# Patient Record
Sex: Male | Born: 1950 | Race: White | Hispanic: No | Smoking: Current every day smoker
Health system: Southern US, Community
[De-identification: ages and names within clinical notes are randomized; demographics above are authoritative.]

## PROBLEM LIST (undated history)

## (undated) DIAGNOSIS — J439 Emphysema, unspecified: Secondary | ICD-10-CM

## (undated) DIAGNOSIS — M1990 Unspecified osteoarthritis, unspecified site: Secondary | ICD-10-CM

## (undated) DIAGNOSIS — Z8719 Personal history of other diseases of the digestive system: Secondary | ICD-10-CM

## (undated) DIAGNOSIS — J449 Chronic obstructive pulmonary disease, unspecified: Secondary | ICD-10-CM

## (undated) DIAGNOSIS — D649 Anemia, unspecified: Secondary | ICD-10-CM

## (undated) DIAGNOSIS — K219 Gastro-esophageal reflux disease without esophagitis: Secondary | ICD-10-CM

## (undated) DIAGNOSIS — Z8711 Personal history of peptic ulcer disease: Secondary | ICD-10-CM

## (undated) DIAGNOSIS — J180 Bronchopneumonia, unspecified organism: Secondary | ICD-10-CM

## (undated) DIAGNOSIS — J45909 Unspecified asthma, uncomplicated: Secondary | ICD-10-CM

## (undated) HISTORY — PX: TONSILLECTOMY AND ADENOIDECTOMY: SUR1326

## (undated) HISTORY — PX: ESOPHAGOGASTRODUODENOSCOPY: SHX1529

## (undated) HISTORY — PX: COLONOSCOPY W/ BIOPSIES AND POLYPECTOMY: SHX1376

## (undated) HISTORY — PX: SKIN CANCER EXCISION: SHX779

---

## 1999-05-17 ENCOUNTER — Emergency Department (HOSPITAL_COMMUNITY): Admission: EM | Admit: 1999-05-17 | Discharge: 1999-05-17 | Payer: Self-pay | Admitting: *Deleted

## 1999-05-17 ENCOUNTER — Encounter: Payer: Self-pay | Admitting: *Deleted

## 1999-05-19 ENCOUNTER — Ambulatory Visit (HOSPITAL_COMMUNITY): Admission: RE | Admit: 1999-05-19 | Discharge: 1999-05-19 | Payer: Self-pay | Admitting: Sports Medicine

## 1999-05-19 ENCOUNTER — Encounter: Payer: Self-pay | Admitting: Sports Medicine

## 1999-05-20 ENCOUNTER — Ambulatory Visit (HOSPITAL_COMMUNITY): Admission: RE | Admit: 1999-05-20 | Discharge: 1999-05-20 | Payer: Self-pay | Admitting: Sports Medicine

## 1999-05-20 ENCOUNTER — Encounter: Payer: Self-pay | Admitting: Sports Medicine

## 1999-06-10 ENCOUNTER — Emergency Department (HOSPITAL_COMMUNITY): Admission: EM | Admit: 1999-06-10 | Discharge: 1999-06-10 | Payer: Self-pay | Admitting: Emergency Medicine

## 1999-06-10 ENCOUNTER — Encounter: Payer: Self-pay | Admitting: Emergency Medicine

## 2005-01-03 ENCOUNTER — Encounter: Admission: RE | Admit: 2005-01-03 | Discharge: 2005-01-03 | Payer: Self-pay | Admitting: Emergency Medicine

## 2010-05-26 DEATH — deceased

## 2010-06-26 DEATH — deceased

## 2017-06-23 ENCOUNTER — Observation Stay (HOSPITAL_COMMUNITY)
Admission: EM | Admit: 2017-06-23 | Discharge: 2017-06-25 | Disposition: A | Discharge status: Home / Self Care | Payer: Commercial Managed Care - PPO | Attending: General Surgery | Admitting: General Surgery

## 2017-06-23 ENCOUNTER — Emergency Department (HOSPITAL_COMMUNITY): Payer: Commercial Managed Care - PPO

## 2017-06-23 ENCOUNTER — Encounter (HOSPITAL_COMMUNITY): Payer: Self-pay | Admitting: Emergency Medicine

## 2017-06-23 ENCOUNTER — Other Ambulatory Visit: Payer: Self-pay

## 2017-06-23 DIAGNOSIS — S2242XA Multiple fractures of ribs, left side, initial encounter for closed fracture: Secondary | ICD-10-CM

## 2017-06-23 DIAGNOSIS — M419 Scoliosis, unspecified: Secondary | ICD-10-CM | POA: Insufficient documentation

## 2017-06-23 DIAGNOSIS — J439 Emphysema, unspecified: Secondary | ICD-10-CM | POA: Diagnosis not present

## 2017-06-23 DIAGNOSIS — N400 Enlarged prostate without lower urinary tract symptoms: Secondary | ICD-10-CM | POA: Diagnosis not present

## 2017-06-23 DIAGNOSIS — S42032A Displaced fracture of lateral end of left clavicle, initial encounter for closed fracture: Secondary | ICD-10-CM | POA: Diagnosis not present

## 2017-06-23 DIAGNOSIS — Z23 Encounter for immunization: Secondary | ICD-10-CM | POA: Insufficient documentation

## 2017-06-23 DIAGNOSIS — I700 Atherosclerosis of aorta: Secondary | ICD-10-CM | POA: Diagnosis not present

## 2017-06-23 DIAGNOSIS — S42002A Fracture of unspecified part of left clavicle, initial encounter for closed fracture: Secondary | ICD-10-CM

## 2017-06-23 DIAGNOSIS — M6281 Muscle weakness (generalized): Secondary | ICD-10-CM | POA: Diagnosis not present

## 2017-06-23 DIAGNOSIS — S2243XA Multiple fractures of ribs, bilateral, initial encounter for closed fracture: Principal | ICD-10-CM | POA: Insufficient documentation

## 2017-06-23 DIAGNOSIS — W2210XA Striking against or struck by unspecified automobile airbag, initial encounter: Secondary | ICD-10-CM | POA: Diagnosis not present

## 2017-06-23 DIAGNOSIS — F17210 Nicotine dependence, cigarettes, uncomplicated: Secondary | ICD-10-CM | POA: Diagnosis not present

## 2017-06-23 DIAGNOSIS — N2889 Other specified disorders of kidney and ureter: Secondary | ICD-10-CM | POA: Diagnosis not present

## 2017-06-23 DIAGNOSIS — R0902 Hypoxemia: Secondary | ICD-10-CM | POA: Diagnosis present

## 2017-06-23 DIAGNOSIS — S2220XA Unspecified fracture of sternum, initial encounter for closed fracture: Secondary | ICD-10-CM | POA: Diagnosis present

## 2017-06-23 DIAGNOSIS — R918 Other nonspecific abnormal finding of lung field: Secondary | ICD-10-CM | POA: Diagnosis not present

## 2017-06-23 HISTORY — DX: Personal history of peptic ulcer disease: Z87.11

## 2017-06-23 HISTORY — DX: Anemia, unspecified: D64.9

## 2017-06-23 HISTORY — DX: Bronchopneumonia, unspecified organism: J18.0

## 2017-06-23 HISTORY — DX: Unspecified osteoarthritis, unspecified site: M19.90

## 2017-06-23 HISTORY — DX: Gastro-esophageal reflux disease without esophagitis: K21.9

## 2017-06-23 HISTORY — DX: Personal history of other diseases of the digestive system: Z87.19

## 2017-06-23 HISTORY — DX: Emphysema, unspecified: J43.9

## 2017-06-23 HISTORY — DX: Unspecified asthma, uncomplicated: J45.909

## 2017-06-23 HISTORY — DX: Chronic obstructive pulmonary disease, unspecified: J44.9

## 2017-06-23 LAB — COMPREHENSIVE METABOLIC PANEL
ALBUMIN: 3.8 g/dL (ref 3.5–5.0)
ALT: 22 U/L (ref 17–63)
ANION GAP: 11 (ref 5–15)
AST: 47 U/L — ABNORMAL HIGH (ref 15–41)
Alkaline Phosphatase: 68 U/L (ref 38–126)
BUN: 14 mg/dL (ref 6–20)
CHLORIDE: 100 mmol/L — AB (ref 101–111)
CO2: 25 mmol/L (ref 22–32)
Calcium: 9.3 mg/dL (ref 8.9–10.3)
Creatinine, Ser: 0.91 mg/dL (ref 0.61–1.24)
GFR calc Af Amer: >60 mL/min (ref >60)
GFR calc non Af Amer: >60 mL/min (ref >60)
Glucose, Bld: 84 mg/dL (ref 65–99)
POTASSIUM: 4.8 mmol/L (ref 3.5–5.1)
SODIUM: 136 mmol/L (ref 135–145)
Total Bilirubin: 0.9 mg/dL (ref 0.3–1.2)
Total Protein: 7.2 g/dL (ref 6.5–8.1)

## 2017-06-23 LAB — CBC
HEMATOCRIT: 47.8 % (ref 39.0–52.0)
HEMOGLOBIN: 15.8 g/dL (ref 13.0–17.0)
MCH: 30.7 pg (ref 26.0–34.0)
MCHC: 33.1 g/dL (ref 30.0–36.0)
MCV: 93 fL (ref 78.0–100.0)
Platelets: 252 10*3/uL (ref 150–400)
RBC: 5.14 MIL/uL (ref 4.22–5.81)
RDW: 14.7 % (ref 11.5–15.5)
WBC: 7.9 10*3/uL (ref 4.0–10.5)

## 2017-06-23 LAB — I-STAT CHEM 8, ED
BUN: 20 mg/dL (ref 6–20)
CALCIUM ION: 1.05 mmol/L — AB (ref 1.15–1.40)
Chloride: 98 mmol/L — ABNORMAL LOW (ref 101–111)
Creatinine, Ser: 1 mg/dL (ref 0.61–1.24)
Glucose, Bld: 89 mg/dL (ref 65–99)
HEMATOCRIT: 51 % (ref 39.0–52.0)
HEMOGLOBIN: 17.3 g/dL — AB (ref 13.0–17.0)
Potassium: 4.7 mmol/L (ref 3.5–5.1)
SODIUM: 136 mmol/L (ref 135–145)
TCO2: 27 mmol/L (ref 22–32)

## 2017-06-23 LAB — I-STAT TROPONIN, ED: Troponin i, poc: 0 ng/mL (ref 0.00–0.08)

## 2017-06-23 MED ORDER — MORPHINE SULFATE (PF) 4 MG/ML IV SOLN
4.0000 mg | Freq: Once | INTRAVENOUS | Status: AC
  Administered 2017-06-23: 4 mg via INTRAVENOUS
  Filled 2017-06-23: qty 1

## 2017-06-23 MED ORDER — NICOTINE 7 MG/24HR TD PT24
7.0000 mg | MEDICATED_PATCH | Freq: Once | TRANSDERMAL | Status: AC
  Administered 2017-06-24: 7 mg via TRANSDERMAL
  Filled 2017-06-23: qty 1

## 2017-06-23 MED ORDER — IOHEXOL 300 MG/ML  SOLN
100.0000 mL | Freq: Once | INTRAMUSCULAR | Status: AC | PRN
  Administered 2017-06-23: 100 mL via INTRAVENOUS
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
## 2017-06-23 NOTE — ED Notes (Signed)
Patient transported to X-ray 

## 2017-06-24 ENCOUNTER — Encounter (HOSPITAL_COMMUNITY): Payer: Self-pay | Admitting: General Practice

## 2017-06-24 LAB — CBC
HEMATOCRIT: 49.6 % (ref 39.0–52.0)
Hemoglobin: 16.5 g/dL (ref 13.0–17.0)
MCH: 31 pg (ref 26.0–34.0)
MCHC: 33.3 g/dL (ref 30.0–36.0)
MCV: 93.2 fL (ref 78.0–100.0)
Platelets: 240 10*3/uL (ref 150–400)
RBC: 5.32 MIL/uL (ref 4.22–5.81)
RDW: 14.6 % (ref 11.5–15.5)
WBC: 7 10*3/uL (ref 4.0–10.5)

## 2017-06-24 LAB — CREATININE, SERUM: Creatinine, Ser: 0.71 mg/dL (ref 0.61–1.24)

## 2017-06-24 MED ORDER — MORPHINE SULFATE (PF) 4 MG/ML IV SOLN
4.0000 mg | Freq: Once | INTRAVENOUS | Status: AC
  Administered 2017-06-24: 4 mg via INTRAVENOUS
  Filled 2017-06-24: qty 1

## 2017-06-24 MED ORDER — ENOXAPARIN SODIUM 40 MG/0.4ML ~~LOC~~ SOLN
40.0000 mg | SUBCUTANEOUS | Status: DC
  Administered 2017-06-24 – 2017-06-25 (×2): 40 mg via SUBCUTANEOUS
  Filled 2017-06-24 (×2): qty 0.4

## 2017-06-24 MED ORDER — TETANUS-DIPHTH-ACELL PERTUSSIS 5-2.5-18.5 LF-MCG/0.5 IM SUSP
0.5000 mL | Freq: Once | INTRAMUSCULAR | Status: AC
  Administered 2017-06-24: 0.5 mL via INTRAMUSCULAR
  Filled 2017-06-24: qty 0.5

## 2017-06-24 MED ORDER — ONDANSETRON 4 MG PO TBDP: 4.0000 mg | ORAL_TABLET | Freq: Four times a day (QID) | ORAL | Status: DC | PRN

## 2017-06-24 MED ORDER — OXYCODONE HCL 5 MG PO TABS
5.0000 mg | ORAL_TABLET | ORAL | Status: DC | PRN
  Administered 2017-06-24 – 2017-06-25 (×4): 5 mg via ORAL
  Filled 2017-06-24 (×4): qty 1

## 2017-06-24 MED ORDER — IPRATROPIUM-ALBUTEROL 0.5-2.5 (3) MG/3ML IN SOLN
3.0000 mL | Freq: Four times a day (QID) | RESPIRATORY_TRACT | Status: DC
  Administered 2017-06-24: 3 mL via RESPIRATORY_TRACT
  Filled 2017-06-24 (×3): qty 3

## 2017-06-24 MED ORDER — DOCUSATE SODIUM 100 MG PO CAPS
100.0000 mg | ORAL_CAPSULE | Freq: Two times a day (BID) | ORAL | Status: DC
  Administered 2017-06-24 – 2017-06-25 (×3): 100 mg via ORAL
  Filled 2017-06-24 (×3): qty 1

## 2017-06-24 MED ORDER — SODIUM CHLORIDE 0.9 % IV SOLN
INTRAVENOUS | Status: DC
  Administered 2017-06-24 (×3): via INTRAVENOUS

## 2017-06-24 MED ORDER — HYDRALAZINE HCL 20 MG/ML IJ SOLN: 10.0000 mg | INTRAMUSCULAR | Status: DC | PRN

## 2017-06-24 MED ORDER — ONDANSETRON HCL 4 MG/2ML IJ SOLN: 4.0000 mg | Freq: Four times a day (QID) | INTRAMUSCULAR | Status: DC | PRN

## 2017-06-24 MED ORDER — PANTOPRAZOLE SODIUM 40 MG PO TBEC
40.0000 mg | DELAYED_RELEASE_TABLET | Freq: Every day | ORAL | Status: DC
  Administered 2017-06-24 – 2017-06-25 (×2): 40 mg via ORAL
  Filled 2017-06-24 (×2): qty 1

## 2017-06-24 MED ORDER — MORPHINE SULFATE (PF) 4 MG/ML IV SOLN
2.0000 mg | INTRAVENOUS | Status: DC | PRN
  Filled 2017-06-24 (×2): qty 1
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
## 2017-06-24 NOTE — ED Notes (Signed)
Pt provided with incentive spirometer and instructed on use and recommended frequency of use
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
## 2017-06-24 NOTE — Progress Notes (Signed)
Orthopedic Tech Progress Note Patient Details:  Robert Jefferson July 14, 1950 [id number]  Ortho Devices Type of Ortho Device: Arm sling Ortho Device/Splint Location: lue Ortho Device/Splint Interventions: Application   Post Interventions Patient Tolerated: Well Instructions Provided: Care of device   Nikki Dom 06/24/2017, 11:08 AM
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
## 2017-06-25 LAB — HIV ANTIBODY (ROUTINE TESTING W REFLEX): HIV SCREEN 4TH GENERATION: NONREACTIVE

## 2017-06-25 MED ORDER — IPRATROPIUM-ALBUTEROL 0.5-2.5 (3) MG/3ML IN SOLN: 3.0000 mL | RESPIRATORY_TRACT | Status: DC | PRN

## 2017-06-25 MED ORDER — OXYCODONE HCL 5 MG PO TABS: 5.0000 mg | ORAL_TABLET | Freq: Four times a day (QID) | ORAL | 0 refills | Status: AC | PRN

## 2017-06-25 NOTE — Progress Notes (Signed)
Discharged home today with wife .Personal belongings,discharged instructions given to patient. Take home meds called in to pharmacy of choice, advised to pick up meds from pharmacy. Verbalized understanding of instructions. No further questions asked
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
## 2020-02-09 ENCOUNTER — Encounter (HOSPITAL_COMMUNITY): Payer: Self-pay

## 2020-02-09 ENCOUNTER — Emergency Department (HOSPITAL_COMMUNITY): Payer: Commercial Managed Care - PPO

## 2020-02-09 ENCOUNTER — Emergency Department (HOSPITAL_COMMUNITY)
Admission: EM | Admit: 2020-02-09 | Discharge: 2020-02-09 | Disposition: A | Discharge status: Home / Self Care | Payer: Commercial Managed Care - PPO | Attending: Emergency Medicine | Admitting: Emergency Medicine

## 2020-02-09 ENCOUNTER — Other Ambulatory Visit: Payer: Self-pay

## 2020-02-09 DIAGNOSIS — J441 Chronic obstructive pulmonary disease with (acute) exacerbation: Secondary | ICD-10-CM | POA: Insufficient documentation

## 2020-02-09 DIAGNOSIS — S12031A Nondisplaced posterior arch fracture of first cervical vertebra, initial encounter for closed fracture: Secondary | ICD-10-CM | POA: Insufficient documentation

## 2020-02-09 DIAGNOSIS — S12191A Other nondisplaced fracture of second cervical vertebra, initial encounter for closed fracture: Secondary | ICD-10-CM | POA: Diagnosis not present

## 2020-02-09 DIAGNOSIS — S12101A Unspecified nondisplaced fracture of second cervical vertebra, initial encounter for closed fracture: Secondary | ICD-10-CM

## 2020-02-09 DIAGNOSIS — S12000A Unspecified displaced fracture of first cervical vertebra, initial encounter for closed fracture: Secondary | ICD-10-CM | POA: Diagnosis present

## 2020-02-09 DIAGNOSIS — W01190A Fall on same level from slipping, tripping and stumbling with subsequent striking against furniture, initial encounter: Secondary | ICD-10-CM | POA: Insufficient documentation

## 2020-02-09 DIAGNOSIS — F17210 Nicotine dependence, cigarettes, uncomplicated: Secondary | ICD-10-CM | POA: Diagnosis not present

## 2020-02-09 DIAGNOSIS — S12001A Unspecified nondisplaced fracture of first cervical vertebra, initial encounter for closed fracture: Secondary | ICD-10-CM

## 2020-02-09 LAB — BASIC METABOLIC PANEL
Anion gap: 11 (ref 5–15)
BUN: 6 mg/dL — ABNORMAL LOW (ref 8–23)
CO2: 28 mmol/L (ref 22–32)
Calcium: 9.7 mg/dL (ref 8.9–10.3)
Chloride: 100 mmol/L (ref 98–111)
Creatinine, Ser: 0.63 mg/dL (ref 0.61–1.24)
GFR, Estimated: >60 mL/min (ref >60)
Glucose, Bld: 95 mg/dL (ref 70–99)
Potassium: 4.5 mmol/L (ref 3.5–5.1)
Sodium: 139 mmol/L (ref 135–145)

## 2020-02-09 LAB — I-STAT CHEM 8, ED
BUN: 8 mg/dL (ref 8–23)
Calcium, Ion: 1.19 mmol/L (ref 1.15–1.40)
Chloride: 100 mmol/L (ref 98–111)
Creatinine, Ser: 0.6 mg/dL — ABNORMAL LOW (ref 0.61–1.24)
Glucose, Bld: 92 mg/dL (ref 70–99)
HCT: 48 % (ref 39.0–52.0)
Hemoglobin: 16.3 g/dL (ref 13.0–17.0)
Potassium: 4.5 mmol/L (ref 3.5–5.1)
Sodium: 140 mmol/L (ref 135–145)
TCO2: 29 mmol/L (ref 22–32)

## 2020-02-09 LAB — CBC WITH DIFFERENTIAL/PLATELET
Abs Immature Granulocytes: 0.03 10*3/uL (ref 0.00–0.07)
Basophils Absolute: 0.1 10*3/uL (ref 0.0–0.1)
Basophils Relative: 1 %
Eosinophils Absolute: 0.2 10*3/uL (ref 0.0–0.5)
Eosinophils Relative: 2 %
HCT: 44.5 % (ref 39.0–52.0)
Hemoglobin: 15.1 g/dL (ref 13.0–17.0)
Immature Granulocytes: 0 %
Lymphocytes Relative: 15 %
Lymphs Abs: 1 10*3/uL (ref 0.7–4.0)
MCH: 33.9 pg (ref 26.0–34.0)
MCHC: 33.9 g/dL (ref 30.0–36.0)
MCV: 99.8 fL (ref 80.0–100.0)
Monocytes Absolute: 0.7 10*3/uL (ref 0.1–1.0)
Monocytes Relative: 10 %
Neutro Abs: 5.1 10*3/uL (ref 1.7–7.7)
Neutrophils Relative %: 72 %
Platelets: 292 10*3/uL (ref 150–400)
RBC: 4.46 MIL/uL (ref 4.22–5.81)
RDW: 14 % (ref 11.5–15.5)
WBC: 7 10*3/uL (ref 4.0–10.5)
nRBC: 0 % (ref 0.0–0.2)

## 2020-02-09 MED ORDER — IPRATROPIUM-ALBUTEROL 0.5-2.5 (3) MG/3ML IN SOLN
3.0000 mL | Freq: Once | RESPIRATORY_TRACT | Status: AC
  Administered 2020-02-09: 3 mL via RESPIRATORY_TRACT
  Filled 2020-02-09: qty 3

## 2020-02-09 MED ORDER — HYDROCODONE-ACETAMINOPHEN 5-325 MG PO TABS
1.0000 | ORAL_TABLET | Freq: Once | ORAL | Status: AC
  Administered 2020-02-09: 1 via ORAL
  Filled 2020-02-09: qty 1

## 2020-02-09 MED ORDER — ALBUTEROL SULFATE HFA 108 (90 BASE) MCG/ACT IN AERS
1.0000 | INHALATION_SPRAY | Freq: Once | RESPIRATORY_TRACT | Status: AC
  Administered 2020-02-09: 1 via RESPIRATORY_TRACT
  Filled 2020-02-09: qty 6.7

## 2020-02-09 MED ORDER — METHYLPREDNISOLONE SODIUM SUCC 125 MG IJ SOLR
125.0000 mg | Freq: Once | INTRAMUSCULAR | Status: AC
  Administered 2020-02-09: 125 mg via INTRAVENOUS
  Filled 2020-02-09: qty 2

## 2020-02-09 MED ORDER — PREDNISONE 10 MG PO TABS: 40.0000 mg | ORAL_TABLET | Freq: Every day | ORAL | 0 refills | Status: AC

## 2020-02-09 MED ORDER — IOHEXOL 350 MG/ML SOLN
75.0000 mL | Freq: Once | INTRAVENOUS | Status: AC | PRN
  Administered 2020-02-09: 75 mL via INTRAVENOUS

## 2020-02-09 NOTE — Discharge Instructions (Addendum)
Take the pain medication as needed to help with your symptoms. You will need to wear the brace at all times including when you shower as directed by the neurosurgeon. Follow-up with Dr. Maisie Fus at the office listed below. Take the steroids beginning tomorrow. If you start to have shortness of breath, increased cough or chest pain return to the ER. Return to the ER if you start to experience additional injuries or falls, numbness in your arms or legs, vision changes, trouble walking
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
## 2020-02-09 NOTE — ED Notes (Signed)
Pt room air SpO2 was 88%. Pt placed on 4L [place] and SpO2 is now 93%
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
## 2020-02-09 NOTE — ED Provider Notes (Signed)
This patient is a 70 year old male, he fell approximately 1 week ago striking the front of his head and since that time has had some neck pain. On my exam the patient is in no distress, he does have some tenderness over the neck and is in a [place] J collar since arrival. Unfortunately the patient does have multiple fractures of his neck none of which seem to be compromising his spinal cord and neurologically the patient is unremarkable. CT angiogram will be performed to rule out vascular injury due to the location of the spinal fractures. At this time the patient appears well, he has been instructed that he will need to wear this neck brace and follow-up with neurosurgery. Neurosurgery was consulted, please see the physician assistant notes.  Of note the patient had some borderline hypoxia, he has COPD, he states he is not feeling short of breath and refuses to be admitted to the hospital or receive further treatment or intervention for his borderline hypoxia.  He has medical decision-making capacity and wants to go home.  He understands he can come back should he change his mind or get worse.  Medical screening examination/treatment/procedure(s) were conducted as a shared visit with non-physician practitioner(s) and myself.  I personally evaluated the patient during the encounter.  Clinical Impression:   Final diagnoses:  Closed nondisplaced fracture of first cervical vertebra, unspecified fracture morphology, initial encounter (HCC)  Closed nondisplaced fracture of second cervical vertebra, unspecified fracture morphology, initial encounter (HCC)  Chronic obstructive pulmonary disease with acute exacerbation (HCC)         Eber Hong, MD 02/10/20 1510

## 2020-04-25 ENCOUNTER — Other Ambulatory Visit: Payer: Self-pay | Admitting: Neurosurgery

## 2020-04-25 DIAGNOSIS — S12130A Unspecified traumatic displaced spondylolisthesis of second cervical vertebra, initial encounter for closed fracture: Secondary | ICD-10-CM

## 2020-05-24 ENCOUNTER — Other Ambulatory Visit: Payer: Self-pay

## 2020-05-24 ENCOUNTER — Ambulatory Visit
Admission: RE | Admit: 2020-05-24 | Discharge: 2020-05-24 | Disposition: A | Discharge status: Home / Self Care | Payer: Commercial Managed Care - PPO | Source: Ambulatory Visit | Attending: Neurosurgery | Admitting: Neurosurgery

## 2020-05-24 DIAGNOSIS — S12130A Unspecified traumatic displaced spondylolisthesis of second cervical vertebra, initial encounter for closed fracture: Secondary | ICD-10-CM

## 2020-07-23 ENCOUNTER — Other Ambulatory Visit: Payer: Self-pay | Admitting: Neurosurgery

## 2020-07-23 DIAGNOSIS — S12130A Unspecified traumatic displaced spondylolisthesis of second cervical vertebra, initial encounter for closed fracture: Secondary | ICD-10-CM

## 2020-08-12 ENCOUNTER — Ambulatory Visit
Admission: RE | Admit: 2020-08-12 | Discharge: 2020-08-12 | Disposition: A | Discharge status: Home / Self Care | Payer: Commercial Managed Care - PPO | Source: Ambulatory Visit | Attending: Neurosurgery | Admitting: Neurosurgery

## 2020-08-12 DIAGNOSIS — S12130A Unspecified traumatic displaced spondylolisthesis of second cervical vertebra, initial encounter for closed fracture: Secondary | ICD-10-CM

## 2022-10-26 IMAGING — CT CT CERVICAL SPINE W/O CM
3 series · 8 of 14 positions shown, 9 images · non-contrast
Comparison: Cervical spine CT 02/09/2020, 06/23/2017.

CLINICAL DATA: 69-year-old male status post C1 anterior ring
fracture, C2 fracture, C3 anterior inferior corner fracture, and C4
spinous process fractures in [REDACTED]. Subsequent encounter.

EXAM:
CT CERVICAL SPINE WITHOUT CONTRAST
TECHNIQUE: Multidetector CT imaging of the cervical spine was performed without
intravenous contrast. Multiplanar CT image reconstructions were also
generated.

[Series 2: cspine soft · axial · 0.36mm/px · z∈[-72,-4]mm · 2 of 102 slices shown]
[im 34/102  soft-tissue]
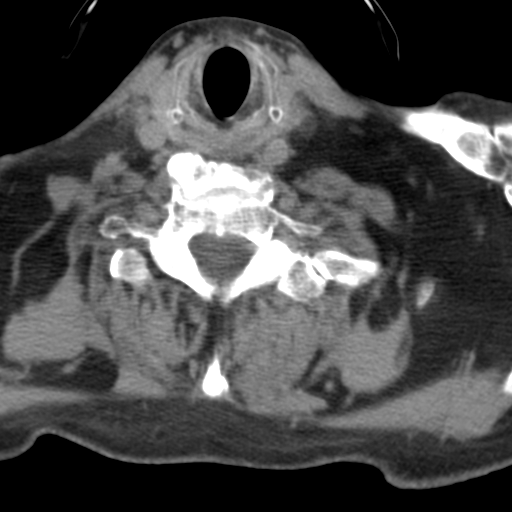
[im 68/102  soft-tissue]
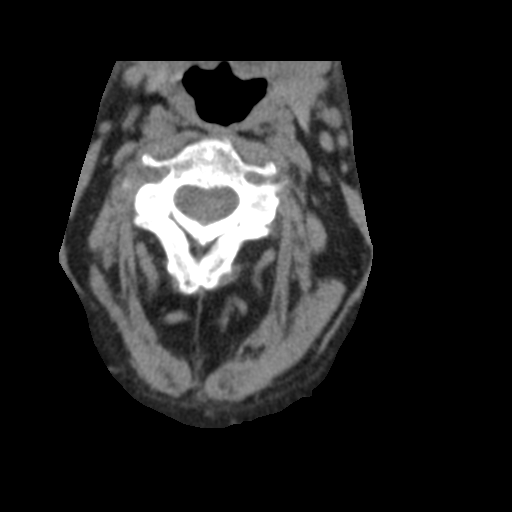

[Series 3: c spine bone · axial · 0.36mm/px · z∈[-86,+14]mm · 3 of 100 slices shown, 4 images]
[im 25/100  soft-tissue]
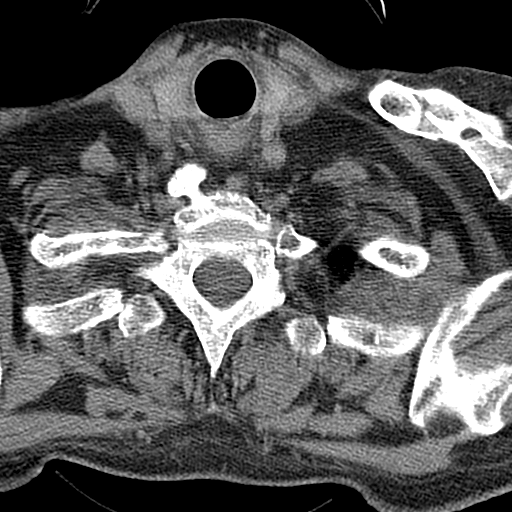
[im 25/100  bone]
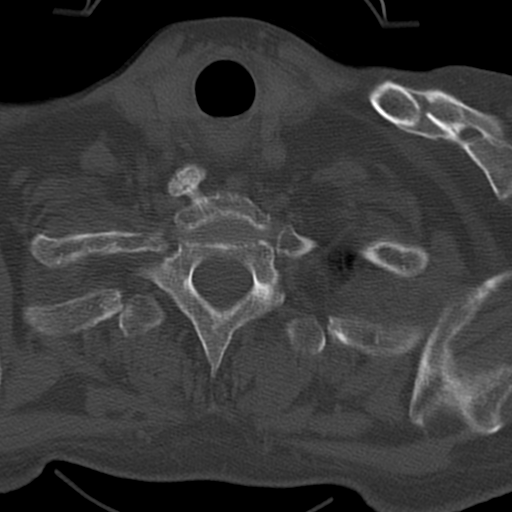
[im 50/100  bone]
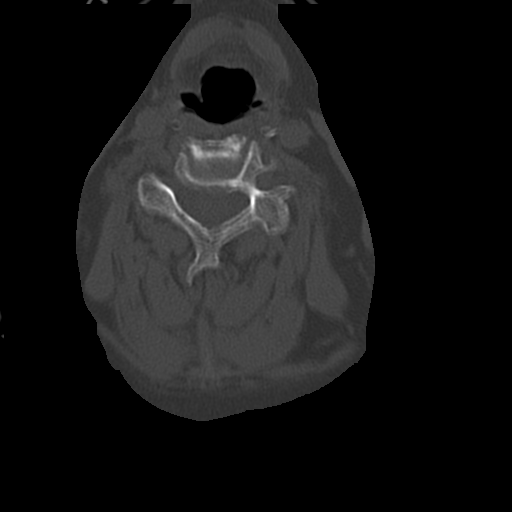
[im 75/100  bone]
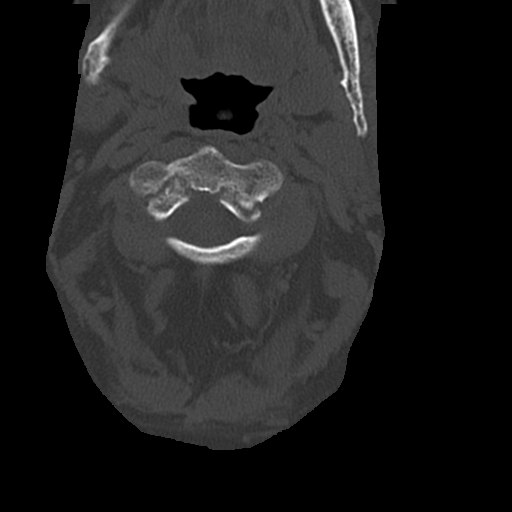

[Series 8: angled axial soft · axial · 0.29mm/px · z∈[-112,-17]mm · 3 of 101 slices shown]
[im 26/101  soft-tissue]
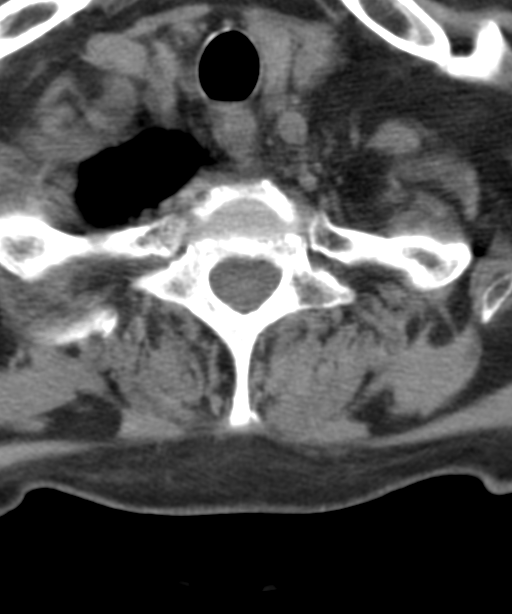
[im 51/101  soft-tissue]
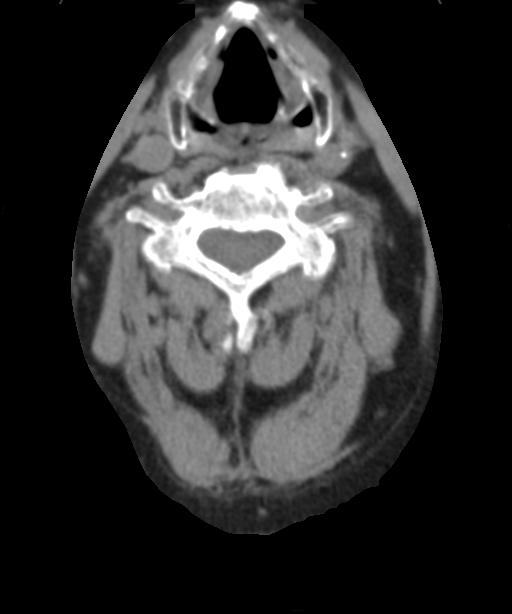
[im 76/101  soft-tissue]
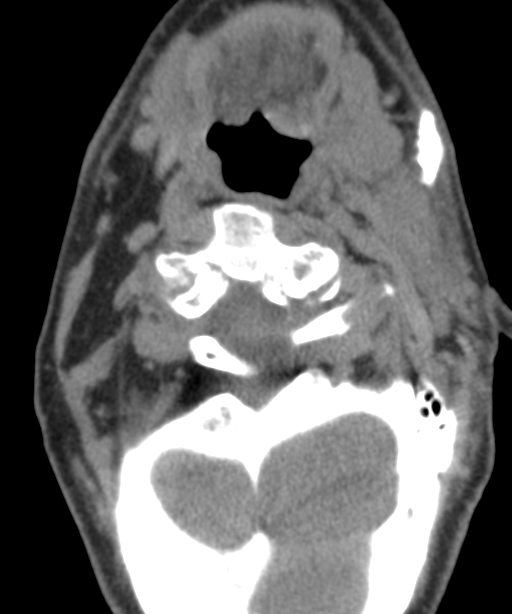

[8 of 14 positions shown; findings below may reference images not displayed]

FINDINGS: Mild motion artifact today.

Improved cervical lordosis since [REDACTED]. Cervicothoracic junction
alignment is within normal limits. Mild anterolisthesis has
developed of C2 on C3 measuring 1-2 mm, new since 8208.

Craniocervical junction alignment is maintained.

C1-C2 alignment is stable since [REDACTED] with persistent mild
diastasis of the anterior C1 ring. No bridging bone there.

Comminuted fractures of the posterolateral C2 body and both C2
pedicles with periosteal new bone formation since [REDACTED] but no
solid bridging bone. Left pedicle fracture remains minimally
displaced. Right pedicle fracture demonstrates less posterior
displacement now.

C2-C3 facet alignment is stable and satisfactory.

Interval healing of subtle prior left anterior C3 inferior corner
fracture.

Stable minimally displaced C4 spinous process fractures.

C5 through C7 appears stable and intact with bulky endplate
spurring. Cervicothoracic junction alignment is within normal
limits.

Soft tissues and spinal canal: No prevertebral fluid or swelling. No
visible canal hematoma. Calcified carotid atherosclerosis, otherwise
negative visible noncontrast neck soft tissues.

Disc levels: Chronic disc and endplate degeneration C5-C6 and C6-C7
appears stable.

Upper chest: Visible upper thoracic levels appear stable and intact.
Chronic left clavicle fracture. Negative visible lung apices.

Other: Stable visible noncontrast brain parenchyma. Visible tympanic
cavities and mastoids remain clear.
IMPRESSION: 1. No significant healing of the anterior C1 ring fracture with
stable mild diastasis since [REDACTED]. Stable C1-C2 alignment.
2. Periosteal new bone at the bilateral C2 pedicle/posterior body
fractures with stable to slightly improved displacement since
[REDACTED]. However, no solid bridging bone yet and mild 1-2 mm of
anterolisthesis of C2 on C3 has developed, new since [DATE]. A subtle left anterior inferior C3 vertebral body corner fracture
is healed.
4. Unchanged C4 spinous process tip fractures.
5. Other cervical vertebrae remain intact and normally aligned.

## 2023-03-09 DIAGNOSIS — I509 Heart failure, unspecified: Secondary | ICD-10-CM | POA: Diagnosis not present

## 2023-04-01 ENCOUNTER — Encounter: Payer: Self-pay | Admitting: Internal Medicine
